# Patient Record
Sex: Male | Born: 1990 | Race: White | Hispanic: No | Marital: Married | Smoking: Never smoker
Health system: Southern US, Community
[De-identification: ages and names within clinical notes are randomized; demographics above are authoritative.]

## PROBLEM LIST (undated history)

## (undated) DIAGNOSIS — Z87442 Personal history of urinary calculi: Secondary | ICD-10-CM

## (undated) HISTORY — PX: OTHER SURGICAL HISTORY: SHX169

---
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
## 2011-01-22 ENCOUNTER — Inpatient Hospital Stay (INDEPENDENT_AMBULATORY_CARE_PROVIDER_SITE_OTHER)
Admission: RE | Admit: 2011-01-22 | Discharge: 2011-01-22 | Disposition: A | Discharge status: Home / Self Care | Payer: BC Managed Care – PPO | Source: Ambulatory Visit | Attending: Family Medicine | Admitting: Family Medicine

## 2011-01-22 DIAGNOSIS — J020 Streptococcal pharyngitis: Secondary | ICD-10-CM

## 2017-01-07 ENCOUNTER — Other Ambulatory Visit: Payer: Self-pay | Admitting: General Surgery

## 2017-01-09 ENCOUNTER — Encounter (HOSPITAL_BASED_OUTPATIENT_CLINIC_OR_DEPARTMENT_OTHER): Payer: Self-pay | Admitting: *Deleted

## 2017-01-09 NOTE — Progress Notes (Signed)
Bring all medications. Explained Boost drink to pt , pt is not planning to come pick up before surgery because he lives 1 1/2 hours away from here. Stated he may come day of surgery early - 2 1/2 hours early so he can drink Boost day of surgery.

## 2017-01-16 ENCOUNTER — Encounter (HOSPITAL_BASED_OUTPATIENT_CLINIC_OR_DEPARTMENT_OTHER)
Admission: RE | Disposition: A | Discharge status: Home / Self Care | Payer: Self-pay | Source: Ambulatory Visit | Attending: General Surgery

## 2017-01-16 ENCOUNTER — Ambulatory Visit (HOSPITAL_BASED_OUTPATIENT_CLINIC_OR_DEPARTMENT_OTHER): Payer: 59 | Admitting: Anesthesiology

## 2017-01-16 ENCOUNTER — Ambulatory Visit (HOSPITAL_BASED_OUTPATIENT_CLINIC_OR_DEPARTMENT_OTHER)
Admission: RE | Admit: 2017-01-16 | Discharge: 2017-01-16 | Disposition: A | Discharge status: Home / Self Care | Payer: 59 | Source: Ambulatory Visit | Attending: General Surgery | Admitting: General Surgery

## 2017-01-16 ENCOUNTER — Encounter (HOSPITAL_BASED_OUTPATIENT_CLINIC_OR_DEPARTMENT_OTHER): Payer: Self-pay | Admitting: Anesthesiology

## 2017-01-16 DIAGNOSIS — Z880 Allergy status to penicillin: Secondary | ICD-10-CM | POA: Diagnosis not present

## 2017-01-16 DIAGNOSIS — K4090 Unilateral inguinal hernia, without obstruction or gangrene, not specified as recurrent: Secondary | ICD-10-CM | POA: Insufficient documentation

## 2017-01-16 DIAGNOSIS — Z882 Allergy status to sulfonamides status: Secondary | ICD-10-CM | POA: Insufficient documentation

## 2017-01-16 DIAGNOSIS — Z87442 Personal history of urinary calculi: Secondary | ICD-10-CM | POA: Insufficient documentation

## 2017-01-16 HISTORY — PX: INSERTION OF MESH: SHX5868

## 2017-01-16 HISTORY — DX: Personal history of urinary calculi: Z87.442

## 2017-01-16 HISTORY — PX: INGUINAL HERNIA REPAIR: SHX194

## 2017-01-16 SURGERY — REPAIR, HERNIA, INGUINAL, ADULT
Anesthesia: General | Site: Groin | Laterality: Left

## 2017-01-16 MED ORDER — FENTANYL CITRATE (PF) 100 MCG/2ML IJ SOLN
INTRAMUSCULAR | Status: AC
  Filled 2017-01-16: qty 2

## 2017-01-16 MED ORDER — CEFAZOLIN SODIUM-DEXTROSE 2-4 GM/100ML-% IV SOLN
2.0000 g | INTRAVENOUS | Status: AC
  Administered 2017-01-16: 2 g via INTRAVENOUS

## 2017-01-16 MED ORDER — CELECOXIB 200 MG PO CAPS
200.0000 mg | ORAL_CAPSULE | ORAL | Status: AC
  Administered 2017-01-16: 200 mg via ORAL

## 2017-01-16 MED ORDER — FENTANYL CITRATE (PF) 100 MCG/2ML IJ SOLN
50.0000 ug | INTRAMUSCULAR | Status: DC | PRN
  Administered 2017-01-16: 100 ug via INTRAVENOUS
  Administered 2017-01-16: 50 ug via INTRAVENOUS

## 2017-01-16 MED ORDER — MEPERIDINE HCL 25 MG/ML IJ SOLN: 6.2500 mg | INTRAMUSCULAR | Status: DC | PRN

## 2017-01-16 MED ORDER — OXYCODONE-ACETAMINOPHEN 10-325 MG PO TABS: 1.0000 | ORAL_TABLET | Freq: Four times a day (QID) | ORAL | 0 refills | Status: AC | PRN

## 2017-01-16 MED ORDER — DEXAMETHASONE SODIUM PHOSPHATE 4 MG/ML IJ SOLN
INTRAMUSCULAR | Status: DC | PRN
  Administered 2017-01-16: 10 mg via INTRAVENOUS

## 2017-01-16 MED ORDER — GABAPENTIN 300 MG PO CAPS
300.0000 mg | ORAL_CAPSULE | ORAL | Status: AC
  Administered 2017-01-16: 300 mg via ORAL

## 2017-01-16 MED ORDER — DEXAMETHASONE SODIUM PHOSPHATE 10 MG/ML IJ SOLN
INTRAMUSCULAR | Status: AC
  Filled 2017-01-16: qty 1

## 2017-01-16 MED ORDER — LACTATED RINGERS IV SOLN
INTRAVENOUS | Status: DC
  Administered 2017-01-16 (×2): via INTRAVENOUS

## 2017-01-16 MED ORDER — CELECOXIB 200 MG PO CAPS
ORAL_CAPSULE | ORAL | Status: AC
  Filled 2017-01-16: qty 1

## 2017-01-16 MED ORDER — PROPOFOL 10 MG/ML IV BOLUS
INTRAVENOUS | Status: DC | PRN
  Administered 2017-01-16: 200 mg via INTRAVENOUS

## 2017-01-16 MED ORDER — GLYCOPYRROLATE 0.2 MG/ML IV SOSY
PREFILLED_SYRINGE | INTRAVENOUS | Status: DC | PRN
  Administered 2017-01-16: .2 mg via INTRAVENOUS

## 2017-01-16 MED ORDER — LIDOCAINE 2% (20 MG/ML) 5 ML SYRINGE
INTRAMUSCULAR | Status: DC | PRN
  Administered 2017-01-16: 80 mg via INTRAVENOUS

## 2017-01-16 MED ORDER — ACETAMINOPHEN 500 MG PO TABS
ORAL_TABLET | ORAL | Status: AC
  Filled 2017-01-16: qty 2

## 2017-01-16 MED ORDER — OXYCODONE HCL 5 MG PO TABS: 5.0000 mg | ORAL_TABLET | Freq: Once | ORAL | Status: DC | PRN

## 2017-01-16 MED ORDER — PROMETHAZINE HCL 25 MG/ML IJ SOLN: 6.2500 mg | INTRAMUSCULAR | Status: DC | PRN

## 2017-01-16 MED ORDER — BUPIVACAINE HCL (PF) 0.25 % IJ SOLN
INTRAMUSCULAR | Status: DC | PRN
  Administered 2017-01-16: 3 mL

## 2017-01-16 MED ORDER — LACTATED RINGERS IV SOLN: INTRAVENOUS | Status: DC

## 2017-01-16 MED ORDER — MIDAZOLAM HCL 2 MG/2ML IJ SOLN
INTRAMUSCULAR | Status: AC
  Filled 2017-01-16: qty 2

## 2017-01-16 MED ORDER — LIDOCAINE 2% (20 MG/ML) 5 ML SYRINGE
INTRAMUSCULAR | Status: AC
  Filled 2017-01-16: qty 5

## 2017-01-16 MED ORDER — BUPIVACAINE-EPINEPHRINE (PF) 0.5% -1:200000 IJ SOLN
INTRAMUSCULAR | Status: DC | PRN
  Administered 2017-01-16: 30 mL

## 2017-01-16 MED ORDER — FENTANYL CITRATE (PF) 100 MCG/2ML IJ SOLN: 25.0000 ug | INTRAMUSCULAR | Status: DC | PRN

## 2017-01-16 MED ORDER — GABAPENTIN 300 MG PO CAPS
ORAL_CAPSULE | ORAL | Status: AC
  Filled 2017-01-16: qty 1

## 2017-01-16 MED ORDER — ACETAMINOPHEN 500 MG PO TABS
1000.0000 mg | ORAL_TABLET | ORAL | Status: AC
  Administered 2017-01-16: 1000 mg via ORAL

## 2017-01-16 MED ORDER — SCOPOLAMINE 1 MG/3DAYS TD PT72: 1.0000 | MEDICATED_PATCH | Freq: Once | TRANSDERMAL | Status: DC | PRN

## 2017-01-16 MED ORDER — MIDAZOLAM HCL 2 MG/2ML IJ SOLN
1.0000 mg | INTRAMUSCULAR | Status: DC | PRN
  Administered 2017-01-16 (×2): 2 mg via INTRAVENOUS

## 2017-01-16 MED ORDER — CEFAZOLIN SODIUM-DEXTROSE 2-4 GM/100ML-% IV SOLN
INTRAVENOUS | Status: AC
  Filled 2017-01-16: qty 100

## 2017-01-16 MED ORDER — ONDANSETRON HCL 4 MG/2ML IJ SOLN
INTRAMUSCULAR | Status: AC
  Filled 2017-01-16: qty 2

## 2017-01-16 MED ORDER — PROPOFOL 10 MG/ML IV BOLUS
INTRAVENOUS | Status: AC
  Filled 2017-01-16: qty 20

## 2017-01-16 MED ORDER — OXYCODONE HCL 5 MG/5ML PO SOLN: 5.0000 mg | Freq: Once | ORAL | Status: DC | PRN

## 2017-01-16 SURGICAL SUPPLY — 41 items
ADH SKN CLS APL DERMABOND .7 (GAUZE/BANDAGES/DRESSINGS) ×1
BLADE CLIPPER SURG (BLADE) IMPLANT
BLADE SURG 15 STRL LF DISP TIS (BLADE) ×1 IMPLANT
BLADE SURG 15 STRL SS (BLADE) ×3
CHLORAPREP W/TINT 26ML (MISCELLANEOUS) ×3 IMPLANT
COVER BACK TABLE 60X90IN (DRAPES) ×3 IMPLANT
COVER MAYO STAND STRL (DRAPES) ×3 IMPLANT
DECANTER SPIKE VIAL GLASS SM (MISCELLANEOUS) IMPLANT
DERMABOND ADVANCED (GAUZE/BANDAGES/DRESSINGS) ×2
DERMABOND ADVANCED .7 DNX12 (GAUZE/BANDAGES/DRESSINGS) ×1 IMPLANT
DRAIN PENROSE 1/2X12 LTX STRL (WOUND CARE) ×3 IMPLANT
DRAPE LAPAROTOMY TRNSV 102X78 (DRAPE) ×3 IMPLANT
DRAPE UTILITY XL STRL (DRAPES) ×3 IMPLANT
ELECT COATED BLADE 2.86 ST (ELECTRODE) ×3 IMPLANT
ELECT REM PT RETURN 9FT ADLT (ELECTROSURGICAL) ×3
ELECTRODE REM PT RTRN 9FT ADLT (ELECTROSURGICAL) ×1 IMPLANT
GLOVE BIO SURGEON STRL SZ7 (GLOVE) ×3 IMPLANT
GLOVE BIOGEL PI IND STRL 7.5 (GLOVE) ×1 IMPLANT
GLOVE BIOGEL PI INDICATOR 7.5 (GLOVE) ×2
GLOVE SURG SS PI 6.5 STRL IVOR (GLOVE) ×4 IMPLANT
GOWN STRL REUS W/ TWL LRG LVL3 (GOWN DISPOSABLE) ×2 IMPLANT
GOWN STRL REUS W/TWL LRG LVL3 (GOWN DISPOSABLE) ×6
MESH ULTRAPRO 3X6 7.6X15CM (Mesh General) ×2 IMPLANT
NEEDLE HYPO 22GX1.5 SAFETY (NEEDLE) ×3 IMPLANT
NS IRRIG 1000ML POUR BTL (IV SOLUTION) IMPLANT
PACK BASIN DAY SURGERY FS (CUSTOM PROCEDURE TRAY) ×3 IMPLANT
PENCIL BUTTON HOLSTER BLD 10FT (ELECTRODE) ×3 IMPLANT
SLEEVE SCD COMPRESS KNEE MED (MISCELLANEOUS) IMPLANT
SPONGE LAP 4X18 X RAY DECT (DISPOSABLE) ×3 IMPLANT
SUT MNCRL AB 4-0 PS2 18 (SUTURE) ×3 IMPLANT
SUT SILK 2 0 SH (SUTURE) IMPLANT
SUT VIC AB 0 SH 27 (SUTURE) IMPLANT
SUT VIC AB 2-0 SH 18 (SUTURE) ×5 IMPLANT
SUT VIC AB 2-0 SH 27 (SUTURE)
SUT VIC AB 2-0 SH 27XBRD (SUTURE) IMPLANT
SUT VIC AB 3-0 SH 27 (SUTURE) ×3
SUT VIC AB 3-0 SH 27X BRD (SUTURE) ×1 IMPLANT
SUT VICRYL AB 3 0 TIES (SUTURE) ×2 IMPLANT
SYR CONTROL 10ML LL (SYRINGE) ×3 IMPLANT
TOWEL OR 17X24 6PK STRL BLUE (TOWEL DISPOSABLE) ×3 IMPLANT
TOWEL OR NON WOVEN STRL DISP B (DISPOSABLE) ×3 IMPLANT

## 2017-01-16 NOTE — Anesthesia Procedure Notes (Signed)
Procedure Name: LMA Insertion Date/Time: 01/16/2017 2:57 PM Performed by: Gar Gibbon Pre-anesthesia Checklist: Patient identified, Emergency Drugs available, Suction available and Patient being monitored Patient Re-evaluated:Patient Re-evaluated prior to inductionOxygen Delivery Method: Circle system utilized Preoxygenation: Pre-oxygenation with 100% oxygen Intubation Type: IV induction Ventilation: Mask ventilation without difficulty LMA: LMA inserted LMA Size: 4.0 Number of attempts: 1 Airway Equipment and Method: Bite block Placement Confirmation: positive ETCO2 Tube secured with: Tape Dental Injury: Teeth and Oropharynx as per pre-operative assessment

## 2017-01-16 NOTE — Anesthesia Preprocedure Evaluation (Addendum)
Anesthesia Evaluation  Patient identified by MRN, date of birth, ID band Patient awake    Reviewed: Allergy & Precautions, NPO status , Patient's Chart, lab work & pertinent test results  Airway Mallampati: I       Dental  (+) Teeth Intact, Dental Advisory Given   Pulmonary neg pulmonary ROS,    breath sounds clear to auscultation       Cardiovascular negative cardio ROS   Rhythm:Regular Rate:Normal     Neuro/Psych negative neurological ROS  negative psych ROS   GI/Hepatic negative GI ROS, Neg liver ROS,   Endo/Other  negative endocrine ROS  Renal/GU negative Renal ROS  negative genitourinary   Musculoskeletal negative musculoskeletal ROS (+)   Abdominal   Peds negative pediatric ROS (+)  Hematology negative hematology ROS (+)   Anesthesia Other Findings   Reproductive/Obstetrics negative OB ROS                            Anesthesia Physical Anesthesia Plan  ASA: II  Anesthesia Plan: General   Post-op Pain Management:  Regional for Post-op pain   Induction: Intravenous  Airway Management Planned:   Additional Equipment:   Intra-op Plan:   Post-operative Plan: Extubation in OR  Informed Consent: I have reviewed the patients History and Physical, chart, labs and discussed the procedure including the risks, benefits and alternatives for the proposed anesthesia with the patient or authorized representative who has indicated his/her understanding and acceptance.   Dental advisory given  Plan Discussed with: CRNA  Anesthesia Plan Comments:         Anesthesia Quick Evaluation

## 2017-01-16 NOTE — Progress Notes (Signed)
Patient ID: Joseph Sanders, male   DOB: 16-Jun-1991, 26 y.o.   MRN: [id number] I reviewed [facility] on day of surgery, rx for postop pain give

## 2017-01-16 NOTE — Discharge Instructions (Signed)
[facility] ° °UMBILICAL OR INGUINAL HERNIA REPAIR: POST OP INSTRUCTIONS ° °Always review your discharge instruction sheet given to you by the facility where your surgery was performed. °IF YOU HAVE DISABILITY OR FAMILY LEAVE FORMS, YOU MUST BRING THEM TO THE OFFICE FOR PROCESSING.   °DO NOT GIVE THEM TO YOUR DOCTOR. ° °1. A  prescription for pain medication may be given to you upon discharge.  Take your pain medication as prescribed, if needed.  If narcotic pain medicine is not needed, then you may take acetaminophen (Tylenol), naprosyn (Alleve) or ibuprofen (Advil) as needed. °2. Take your usually prescribed medications unless otherwise directed. °3. If you need a refill on your pain medication, please contact your pharmacy.  They will contact our office to request authorization. Prescriptions will not be filled after 5 pm or on week-ends. °4. You should follow a light diet the first 24 hours after arrival home, such as soup and crackers, etc.  Be sure to include lots of fluids daily.  Resume your normal diet the day after surgery. °5. Most patients will experience some swelling and bruising around the umbilicus or in the groin and scrotum.  Ice packs and reclining will help.  Swelling and bruising can take several days to resolve.  °6. It is common to experience some constipation if taking pain medication after surgery.  Increasing fluid intake and taking a stool softener (such as Colace) will usually help or prevent this problem from occurring.  A mild laxative (Milk of Magnesia or Miralax) should be taken according to package directions if there are no bowel movements after 48 hours. °7. Unless discharge instructions indicate otherwise, you may remove your bandages 48 hours after surgery, and you may shower at that time.  You may have steri-strips (small skin tapes) in place directly over the incision.  These strips should be left on the skin for 7-10 days and will come off on their own.   If your surgeon used skin glue on the incision, you may shower in 24 hours.  The glue will flake off over the next 2-3 weeks.  Any sutures or staples will be removed at the office during your follow-up visit. °8. ACTIVITIES:  You may resume regular (light) daily activities beginning the next day--such as daily self-care, walking, climbing stairs--gradually increasing activities as tolerated.  You may have sexual intercourse when it is comfortable.  Refrain from any heavy lifting or straining until approved by your doctor. °a. You may drive when you are no longer taking prescription pain medication, you can comfortably wear a seatbelt, and you can safely maneuver your car and apply brakes. °b. RETURN TO WORK:  __________________________________________________________ °9. You should see your doctor in the office for a follow-up appointment approximately 2-3 weeks after your surgery.  Make sure that you call for this appointment within a day or two after you arrive home to insure a convenient appointment time. °10. OTHER INSTRUCTIONS:  __________________________________________________________________________________________________________________________________________________________________________________________  °WHEN TO CALL YOUR DOCTOR: °1. Fever over 101.0 °2. Inability to urinate °3. Nausea and/or vomiting °4. Extreme swelling or bruising °5. Continued bleeding from incision. °6. Increased pain, redness, or drainage from the incision ° °The clinic staff is available to answer your questions during regular business hours.  Please don’t hesitate to call and ask to speak to one of the nurses for clinical concerns.  If you have a medical emergency, go to the nearest emergency room or call 911.  A surgeon from [facility]   is always on call at the hospital ° ° °[address], [address], [place], [place]  [address] ? ° [address] [address], [place], [place]   [address] °[phone] ? [phone] ? FAX  [phone] °Web site: [email or web address] ° ° °Post Anesthesia Home Care Instructions ° °Activity: °Get plenty of rest for the remainder of the day. A responsible individual must stay with you for 24 hours following the procedure.  °For the next 24 hours, DO NOT: °-Drive a car °-Operate machinery °-Drink alcoholic beverages °-Take any medication unless instructed by your physician °-Make any legal decisions or sign important papers. ° °Meals: °Start with liquid foods such as gelatin or soup. Progress to regular foods as tolerated. Avoid greasy, spicy, heavy foods. If nausea and/or vomiting occur, drink only clear liquids until the nausea and/or vomiting subsides. Call your physician if vomiting continues. ° °Special Instructions/Symptoms: °Your throat may feel dry or sore from the anesthesia or the breathing tube placed in your throat during surgery. If this causes discomfort, gargle with warm salt water. The discomfort should disappear within 24 hours. ° °If you had a scopolamine patch placed behind your ear for the management of post- operative nausea and/or vomiting: ° °1. The medication in the patch is effective for 72 hours, after which it should be removed.  Wrap patch in a tissue and discard in the trash. Wash hands thoroughly with soap and water. °2. You may remove the patch earlier than 72 hours if you experience unpleasant side effects which may include dry mouth, dizziness or visual disturbances. °3. Avoid touching the patch. Wash your hands with soap and water after contact with the patch. °  ° °

## 2017-01-16 NOTE — Progress Notes (Signed)
Assisted Dr. Hollis with left, ultrasound guided, transabdominal plane block. Side rails up, monitors on throughout procedure. See vital signs in flow sheet. Tolerated Procedure well. 

## 2017-01-16 NOTE — Anesthesia Postprocedure Evaluation (Addendum)
**Note Joseph-Identified via Obfuscation** Anesthesia Post Note  Patient: Joseph Sanders  Procedure(s) Performed: Procedure(s) (LRB): LEFT INGUINAL HERNIA REPAIR (Left) INSERTION OF MESH (Left)  Patient location during evaluation: PACU Anesthesia Type: General Level of consciousness: awake and alert Pain management: pain level controlled Vital Signs Assessment: post-procedure vital signs reviewed and stable Respiratory status: spontaneous breathing, nonlabored ventilation, respiratory function stable and patient connected to nasal cannula oxygen Cardiovascular status: blood pressure returned to baseline and stable Postop Assessment: no signs of nausea or vomiting Anesthetic complications: no       Last Vitals:  Vitals:   01/16/17 1615 01/16/17 1642  BP: (!) 127/94 (!) 121/92  Pulse:  (!) 56  Resp: 16 16  Temp:  36.6 C    Last Pain:  Vitals:   01/16/17 1642  TempSrc: Oral  PainSc: 3                  Shelton Silvas
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
## 2017-01-16 NOTE — Transfer of Care (Signed)
Immediate Anesthesia Transfer of Care Note  Patient: Joseph Sanders  Procedure(s) Performed: Procedure(s): LEFT INGUINAL HERNIA REPAIR (Left) INSERTION OF MESH (Left)  Patient Location: PACU  Anesthesia Type:GA combined with regional for post-op pain  Level of Consciousness: awake, sedated and patient cooperative  Airway & Oxygen Therapy: Patient Spontanous Breathing and Patient connected to face mask oxygen  Post-op Assessment: Report given to RN and Post -op Vital signs reviewed and stable  Post vital signs: Reviewed and stable  Last Vitals:  Vitals:   01/16/17 1439 01/16/17 1440  BP:  121/68  Pulse: 64 (!) 58  Resp: 20 (!) 21  Temp:      Last Pain:  Vitals:   01/16/17 1440  TempSrc:   PainSc: 0-No pain         Complications: No apparent anesthesia complications

## 2017-01-16 NOTE — Interval H&P Note (Signed)
History and Physical Interval Note:  01/16/2017 2:09 PM  Joseph Sanders  has presented today for surgery, with the diagnosis of left inguinal hernia  The various methods of treatment have been discussed with the patient and family. After consideration of risks, benefits and other options for treatment, the patient has consented to  Procedure(s): LEFT INGUINAL HERNIA REPAIR (Left) INSERTION OF MESH (Left) as a surgical intervention .  The patient's history has been reviewed, patient examined, no change in status, stable for surgery.  I have reviewed the patient's chart and labs.  Questions were answered to the patient's satisfaction.     [name]

## 2017-01-16 NOTE — Anesthesia Procedure Notes (Addendum)
Anesthesia Regional Block: TAP block   Pre-Anesthetic Checklist: ,, timeout performed, Correct Patient, Correct Site, Correct Laterality, Correct Procedure, Correct Position, site marked, Risks and benefits discussed,  Surgical consent,  Pre-op evaluation,  At surgeon's request and post-op pain management  Laterality: Left  Prep: chloraprep       Needles:  Injection technique: Single-shot  Needle Type: Echogenic Needle     Needle Length: 9cm  Needle Gauge: 21     Additional Needles:   Procedures: ultrasound guided,,,,,,,,  Narrative:  Start time: 01/16/2017 2:35 PM End time: 01/16/2017 2:40 PM Injection made incrementally with aspirations every 5 mL.  Performed by: Personally  Anesthesiologist: Shona Simpson D  Additional Notes: Tolerated well.

## 2017-01-16 NOTE — Op Note (Signed)
Preoperative diagnosis: left inguinal hernia Postoperative diagnosis: left inguinal hernia, direct Procedure: leftinguinal hernia repair with ultrapro mesh Surgeon: Dr Harden Mo Anesthesia: Gen. with a TAPblock Estimated blood loss: Minimal Drains: None Specimens: None Complications: None Disposition to recovery in stable condition  Indications: This is a 48 yom with symptomatic left inguinal hernia We discussed all of his operative risks and elected to proceed with inguinal hernia repair.  Procedure: After informed consent was obtained the patient was taken to the operating room. He underwent a TAPblock first. He was given antibiotics. He was placed under general anesthesia. He was prepped and draped in standard sterile surgical fashion. A timeout was performed.  I infiltrated marcaine in the skin. I made a leftgroin incision.I then identified the external oblique. I then opened the external oblique. I then noted him to have a small direct hernia. There was no indirect hernia present.  I then placed an ultrapro mesh patch over the floor. I then sutured the mesh to the pubic tubercle with 2-0 vicryl. I made a cut and wrapped this around the spermatic cord. I secured this every half centimeter to the inguinal ligament as well. I then secured this to the internal oblique superiorly. I laid the lateral portion flat. This completely obliterated the defect. Hemostasis wasobserved. I then closed this with 2-0 Vicryl and 3-0 Vicryl and 4-0 Monocryl. Dermabond and Steri-Strips were applied. He tolerated this well was extubated and transferred to recovery in stable condition.

## 2017-01-17 ENCOUNTER — Encounter (HOSPITAL_BASED_OUTPATIENT_CLINIC_OR_DEPARTMENT_OTHER): Payer: Self-pay | Admitting: General Surgery

## 2017-02-22 NOTE — Addendum Note (Signed)
Addendum  created 02/22/17 1248 by Shelton SilvasHollis, Geraldina Parrott D, MD   Sign clinical note

## 2020-04-30 ENCOUNTER — Emergency Department (HOSPITAL_COMMUNITY)
Admission: EM | Admit: 2020-04-30 | Discharge: 2020-04-30 | Disposition: A | Discharge status: Home / Self Care | Payer: Commercial Managed Care - PPO | Attending: Emergency Medicine | Admitting: Emergency Medicine

## 2020-04-30 ENCOUNTER — Other Ambulatory Visit: Payer: Self-pay

## 2020-04-30 ENCOUNTER — Emergency Department (HOSPITAL_COMMUNITY): Payer: Commercial Managed Care - PPO

## 2020-04-30 ENCOUNTER — Encounter (HOSPITAL_COMMUNITY): Payer: Self-pay

## 2020-04-30 DIAGNOSIS — U071 COVID-19: Secondary | ICD-10-CM

## 2020-04-30 DIAGNOSIS — R0602 Shortness of breath: Secondary | ICD-10-CM | POA: Diagnosis present

## 2020-04-30 DIAGNOSIS — J1282 Pneumonia due to coronavirus disease 2019: Secondary | ICD-10-CM | POA: Insufficient documentation

## 2020-04-30 LAB — BASIC METABOLIC PANEL
Anion gap: 10 (ref 5–15)
BUN: 12 mg/dL (ref 6–20)
CO2: 26 mmol/L (ref 22–32)
Calcium: 8.1 mg/dL — ABNORMAL LOW (ref 8.9–10.3)
Chloride: 99 mmol/L (ref 98–111)
Creatinine, Ser: 1.15 mg/dL (ref 0.61–1.24)
GFR calc Af Amer: >60 mL/min (ref >60)
GFR calc non Af Amer: >60 mL/min (ref >60)
Glucose, Bld: 134 mg/dL — ABNORMAL HIGH (ref 70–99)
Potassium: 3.9 mmol/L (ref 3.5–5.1)
Sodium: 135 mmol/L (ref 135–145)

## 2020-04-30 LAB — CBC WITH DIFFERENTIAL/PLATELET
Abs Immature Granulocytes: 0 10*3/uL (ref 0.00–0.07)
Basophils Absolute: 0 10*3/uL (ref 0.0–0.1)
Basophils Relative: 0 %
Eosinophils Absolute: 0 10*3/uL (ref 0.0–0.5)
Eosinophils Relative: 0 %
HCT: 45.4 % (ref 39.0–52.0)
Hemoglobin: 15.1 g/dL (ref 13.0–17.0)
Immature Granulocytes: 0 %
Lymphocytes Relative: 28 %
Lymphs Abs: 0.9 10*3/uL (ref 0.7–4.0)
MCH: 32 pg (ref 26.0–34.0)
MCHC: 33.3 g/dL (ref 30.0–36.0)
MCV: 96.2 fL (ref 80.0–100.0)
Monocytes Absolute: 0.2 10*3/uL (ref 0.1–1.0)
Monocytes Relative: 7 %
Neutro Abs: 2 10*3/uL (ref 1.7–7.7)
Neutrophils Relative %: 65 %
Platelets: 121 10*3/uL — ABNORMAL LOW (ref 150–400)
RBC: 4.72 MIL/uL (ref 4.22–5.81)
RDW: 12.7 % (ref 11.5–15.5)
WBC: 3.2 10*3/uL — ABNORMAL LOW (ref 4.0–10.5)
nRBC: 0 % (ref 0.0–0.2)

## 2020-04-30 LAB — TROPONIN I (HIGH SENSITIVITY)
Troponin I (High Sensitivity): 4 ng/L (ref <18)
Troponin I (High Sensitivity): 3 ng/L (ref <18)

## 2020-04-30 LAB — SARS CORONAVIRUS 2 BY RT PCR (HOSPITAL ORDER, PERFORMED IN ~~LOC~~ HOSPITAL LAB): SARS Coronavirus 2: POSITIVE — AB

## 2020-04-30 LAB — D-DIMER, QUANTITATIVE: D-Dimer, Quant: 0.71 ug/mL-FEU — ABNORMAL HIGH (ref 0.00–0.50)

## 2020-04-30 MED ORDER — IOHEXOL 350 MG/ML SOLN
75.0000 mL | Freq: Once | INTRAVENOUS | Status: AC | PRN
  Administered 2020-04-30: 75 mL via INTRAVENOUS

## 2020-04-30 MED ORDER — AEROCHAMBER PLUS FLO-VU MISC
1.0000 | Freq: Once | Status: AC
  Administered 2020-04-30: 1
  Filled 2020-04-30: qty 1

## 2020-04-30 MED ORDER — ALBUTEROL SULFATE HFA 108 (90 BASE) MCG/ACT IN AERS
1.0000 | INHALATION_SPRAY | RESPIRATORY_TRACT | Status: DC | PRN
  Administered 2020-04-30: 2 via RESPIRATORY_TRACT
  Filled 2020-04-30: qty 6.7

## 2020-04-30 MED ORDER — DEXAMETHASONE SODIUM PHOSPHATE 10 MG/ML IJ SOLN
10.0000 mg | Freq: Once | INTRAMUSCULAR | Status: AC
  Administered 2020-04-30: 10 mg via INTRAVENOUS
  Filled 2020-04-30: qty 1

## 2020-04-30 NOTE — ED Notes (Signed)
Date and time results received: 04/30/20 1725 (use smartphrase ".now" to insert current time)  Test: Covid Critical Value: Positive  Name of Provider Notified:Brandon St. James PA  Orders Received? Or Actions Taken?: NA

## 2020-04-30 NOTE — ED Notes (Signed)
o2 dropped to 92% while walking around room, it went back up to 96% after laying back down.

## 2020-04-30 NOTE — ED Provider Notes (Addendum)
[facility] EMERGENCY DEPARTMENT Provider Note   CSN: [id number] Arrival date & time: 04/30/20  1526     History Chief Complaint  Patient presents with  . Shortness of Breath    Joseph Sanders is a 29 y.o. male history of kidney stones and hernia repair.  Otherwise healthy no daily medication use.  Patient reports that on Sunday, April 24, 2020 he developed a fever of 102.8 F.  He took Tylenol with resolution of the fever and felt well the rest of the day as well as on Monday.  On Tuesday, August 3 his fever returned and he developed congestion and nonproductive cough.  The symptoms continued to worsen and then 2 days ago he developed shortness of breath and chest pain.  He describes shortness of breath as difficulty catching a full breath with some pain with deep inhalation, pain is sharp generalized without radiation, moderate intensity improves with rest.  He reports loss of taste over the last 2 days as well as generalized body aches.  Patient reports that he has not received the COVID-19 vaccination.  Denies headache, vision changes, neck stiffness, hemoptysis, abdominal pain, vomiting, diarrhea, extremity swelling/color change or any additional concerns.  HPI     Past Medical History:  Diagnosis Date  . History of kidney stones    2010    There are no problems to display for this patient.   Past Surgical History:  Procedure Laterality Date  . INGUINAL HERNIA REPAIR Left 01/16/2017   Procedure: LEFT INGUINAL HERNIA REPAIR;  Surgeon: Emelia Loron, MD;  Location: [facility];  Service: General;  Laterality: Left;  . INSERTION OF MESH Left 01/16/2017   Procedure: INSERTION OF MESH;  Surgeon: Emelia Loron, MD;  Location: [facility];  Service: General;  Laterality: Left;  . removal of kidney stone    . wisdom teeth removal         No family history on file.  Social History   Tobacco Use  . Smoking status: Never Smoker  .  Smokeless tobacco: Current User    Types: Snuff  Substance Use Topics  . Alcohol use: Yes    Comment: 1-2 drinks a month  . Drug use: No    Home Medications Prior to Admission medications   Medication Sig Start Date End Date Taking? Authorizing Provider  lamoTRIgine (LAMICTAL) 25 MG tablet Take 50 mg by mouth daily. 04/29/20  Yes [provider]  Multiple Vitamin (MULTIVITAMIN) tablet Take 1 tablet by mouth daily.   Yes [provider]  Omega-3 Fatty Acids (FISH OIL PO) Take by mouth.   Yes [provider]  Whey Protein POWD Take by mouth.   Yes [provider]    Allergies    Penicillins and Sulfa antibiotics  Review of Systems   Review of Systems Ten systems are reviewed and are negative for acute change except as noted in the HPI  Physical Exam Updated Vital Signs BP 116/72   Pulse 74   Temp 99 F (37.2 C) (Oral)   Resp 17   Ht 5\' 11"  (1.803 m)   Wt 83.9 kg   SpO2 97%   BMI 25.80 kg/m   Physical Exam Constitutional:      General: He is not in acute distress.    Appearance: Normal appearance. He is well-developed. He is not ill-appearing or diaphoretic.  HENT:     Head: Normocephalic and atraumatic.  Eyes:     General: Vision grossly intact.  Gaze aligned appropriately.     Pupils: Pupils are equal, round, and reactive to light.  Neck:     Trachea: Trachea and phonation normal.     Meningeal: Brudzinski's sign absent.  Cardiovascular:     Rate and Rhythm: Normal rate and regular rhythm.  Pulmonary:     Effort: Pulmonary effort is normal. No respiratory distress.     Breath sounds: Normal breath sounds.  Abdominal:     General: There is no distension.     Palpations: Abdomen is soft.     Tenderness: There is no abdominal tenderness. There is no guarding or rebound.  Musculoskeletal:        General: Normal range of motion.     Cervical back: Normal range of motion and neck supple.     Right lower leg: No tenderness. No  edema.     Left lower leg: No tenderness. No edema.  Skin:    General: Skin is warm and dry.  Neurological:     Mental Status: He is alert.     GCS: GCS eye subscore is 4. GCS verbal subscore is 5. GCS motor subscore is 6.     Comments: Speech is clear and goal oriented, follows commands Major Cranial nerves without deficit, no facial droop Moves extremities without ataxia, coordination intact  Psychiatric:        Behavior: Behavior normal.     ED Results / Procedures / Treatments   Labs (all labs ordered are listed, but only abnormal results are displayed) Labs Reviewed  SARS CORONAVIRUS 2 BY RT PCR (HOSPITAL ORDER, PERFORMED IN [facility]) - Abnormal; Notable for the following components:      Result Value   SARS Coronavirus 2 POSITIVE (*)    All other components within normal limits  BASIC METABOLIC PANEL - Abnormal; Notable for the following components:   Glucose, Bld 134 (*)    Calcium 8.1 (*)    All other components within normal limits  CBC WITH DIFFERENTIAL/PLATELET - Abnormal; Notable for the following components:   WBC 3.2 (*)    Platelets 121 (*)    All other components within normal limits  D-DIMER, QUANTITATIVE (NOT AT [facility]) - Abnormal; Notable for the following components:   D-Dimer, Quant 0.71 (*)    All other components within normal limits  TROPONIN I (HIGH SENSITIVITY)  TROPONIN I (HIGH SENSITIVITY)    EKG EKG Interpretation  Date/Time:  Saturday April 30 2020 15:47:37 EDT Ventricular Rate:  88 PR Interval:    QRS Duration: 82 QT Interval:  337 QTC Calculation: 408 R Axis:   85 Text Interpretation: Sinus rhythm RAE, consider biatrial enlargement Borderline T abnormalities, inferior leads Confirmed by Raeford Razor [phone]) on 04/30/2020 5:12:03 PM   Radiology CT Angio Chest PE W and/or Wo Contrast  Result Date: 04/30/2020 CLINICAL DATA:  Suspect pulmonary embolus. Covid-19 positive. High probability positive for pulmonary embolus.  Increased shortness of breath. Fever, congestion. EXAM: CT ANGIOGRAPHY CHEST WITH CONTRAST TECHNIQUE: Multidetector CT imaging of the chest was performed using the standard protocol during bolus administration of intravenous contrast. Multiplanar CT image reconstructions and MIPs were obtained to evaluate the vascular anatomy. CONTRAST:  11mL OMNIPAQUE IOHEXOL 350 MG/ML SOLN COMPARISON:  04/30/2020 chest x-ray FINDINGS: Cardiovascular: Heart size is normal. No pericardial effusion. Pulmonary arteries are moderately well opacified. There is no acute pulmonary embolus. No significant atherosclerotic calcification of coronary vessels. Thoracic aorta is unremarkable. Mediastinum/Nodes: The visualized portion of the thyroid gland has a  normal appearance. Esophagus is unremarkable. Lungs/Pleura: There are numerous patchy irregular areas of consolidation within the periphery of all lobes. Confluent opacity is identified in the posterior LOWER lobes bilaterally. No pulmonary edema. No pleural effusions. Upper Abdomen: No acute abnormality. Musculoskeletal: No chest Spadaccini abnormality. No acute or significant osseous findings. Review of the MIP images confirms the above findings. IMPRESSION: 1. Technically adequate exam showing no acute pulmonary embolus. 2. Numerous patchy irregular areas of consolidation primarily within the periphery of all lobes with confluent opacity in the posterior LOWER lobes bilaterally. Findings are consistent with infectious process and is consistent with COVID-19 pneumonia. Electronically Signed   By: Norva Pavlov M.D.   On: 04/30/2020 18:02   DG Chest Port 1 View  Result Date: 04/30/2020 CLINICAL DATA:  Cough, shortness of breath and fever. EXAM: PORTABLE CHEST 1 VIEW COMPARISON:  None. FINDINGS: Mild left perihilar atelectasis and/or infiltrate is seen. There is no evidence of a pleural effusion or pneumothorax. The heart size and mediastinal contours are within normal limits. The  visualized skeletal structures are unremarkable. IMPRESSION: Mild left perihilar atelectasis and/or infiltrate. Electronically Signed   By: Aram Candela M.D.   On: 04/30/2020 16:20    Procedures Procedures (including critical care time)  Medications Ordered in ED Medications  dexamethasone (DECADRON) injection 10 mg (has no administration in time range)  albuterol (VENTOLIN HFA) 108 (90 Base) MCG/ACT inhaler 1-2 puff (has no administration in time range)  aerochamber plus with mask device 1 each (has no administration in time range)  iohexol (OMNIPAQUE) 350 MG/ML injection 75 mL (75 mLs Intravenous Contrast Given 04/30/20 1740)    ED Course  I have reviewed the triage vital signs and the nursing notes.  Pertinent labs & imaging results that were available during my care of the patient were reviewed by me and considered in my medical decision making (see chart for details).    MDM Rules/Calculators/A&P                          Additional history obtained from: 1. Nursing notes from this visit. ------------------------ I ordered, reviewed and interpreted labs which include: Covid test positive. D-dimer elevated 0.71, CT PE study ordered. Initial and high-sensitivity troponin within normal limits (4->3). CBC shows mild leukopenia and thrombocytopenia consistent with COVID-19 infection.   BMP shows no emergent electrolyte derangement, AKI or gap.  EKG: Sinus rhythm RAE, consider biatrial enlargement Borderline T abnormalities, inferior leads Confirmed by Raeford Razor [phone]) on 04/30/2020 5:12:03 PM  CXR: Sinus rhythm RAE, consider biatrial enlargement Borderline T abnormalities, inferior leads Confirmed by Raeford Razor [phone]) on 04/30/2020 5:12:03 PM  CT Angio PE Study:  IMPRESSION:  1. Technically adequate exam showing no acute pulmonary embolus.  2. Numerous patchy irregular areas of consolidation primarily within  the periphery of all lobes with confluent opacity in the  posterior  LOWER lobes bilaterally. Findings are consistent with infectious  process and is consistent with COVID-19 pneumonia.  - Suspect patient symptoms today secondary to COVID-19 viral infection.  No evidence of ACS, PE, dissection or other acute cardiopulmonary etiologies at this time.  No indication for antibiotics.  Patient ambulated with nursing staff SPO2 at 92 percent on room air.  Does not meet admission criteria.  Discussed case with Dr. Juleen China. Will treat with Decadron and referred to monoclonal antibody infusion center. Message sent to [facility].  At this time there does not appear to be any evidence of an acute  emergency medical condition and the patient appears stable for discharge with appropriate outpatient follow up. Diagnosis was discussed with patient who verbalizes understanding of care plan and is agreeable to discharge. I have discussed return precautions with patient who verbalizes understanding. Patient encouraged to follow-up with their PCP. All questions answered.  Patient's case discussed with Dr. Juleen ChinaKohut who agrees with plan to discharge with follow-up.   Daiki A Gugel was evaluated in Emergency Department on 04/30/2020 for the symptoms described in the history of present illness. He was evaluated in the context of the global COVID-19 pandemic, which necessitated consideration that the patient might be at risk for infection with the SARS-CoV-2 virus that causes COVID-19. Institutional protocols and algorithms that pertain to the evaluation of patients at risk for COVID-19 are in a state of rapid change based on information released by regulatory bodies including the CDC and federal and state organizations. These policies and algorithms were followed during the patient's care in the ED.  Note: Portions of this report may have been transcribed using voice recognition software. Every effort was made to ensure accuracy; however, inadvertent computerized transcription errors may still be  present. Final Clinical Impression(s) / ED Diagnoses Final diagnoses:  COVID-19 virus detected  Pneumonia due to COVID-19 virus    Rx / DC Orders ED Discharge Orders    None       Bill SalinasMorelli, Ninnie Fein A, PA-C 04/30/20 1911    Elizabeth PalauMorelli, Francina Beery A, PA-C 04/30/20 1915    Raeford RazorKohut, Stephen, MD 05/02/20 0009

## 2020-04-30 NOTE — Discharge Instructions (Addendum)
At this time there does not appear to be the presence of an emergent medical condition, however there is always the potential for conditions to change. Please read and follow the below instructions.  Please return to the Emergency Department immediately for any new or worsening symptoms. Please be sure to follow up with your Primary Care Provider within one week regarding your visit today; please call their office to schedule an appointment even if you are feeling better for a follow-up visit. You are referred to the monoclonal antibody infusion center.  They should call you in the next 1 to 2 days to schedule you an appointment if you qualify for infusion.  Please keep your phone on you.  Drink plenty water and get plenty of rest.  You may use over-the-counter anti-inflammatory such as Tylenol to help with your symptoms.  You may use the albuterol inhaler given to you today as needed if you develop wheezing.  If this medication does not seem to be helping then return to the ER.  Get help right away if: You have trouble breathing. You have pain or pressure in your chest. You have confusion. You have bluish lips and fingernails. You have difficulty waking from sleep. You have any new/concerning or worsening of symptoms. These symptoms may represent a serious problem that is an emergency. Do not wait to see if the symptoms will go away. Get medical help right away. Call your local emergency services (911 in the [place] Do not drive yourself to the hospital. Let the emergency medical personnel know if you think you have COVID-19.  Please read the additional information packets attached to your discharge summary.  Do not take your medicine if  develop an itchy rash, swelling in your mouth or lips, or difficulty breathing; call 911 and seek immediate emergency medical attention if this occurs.  You may review your lab tests and imaging results in their entirety on your MyChart account.  Please discuss  all results of fully with your primary care provider and other specialist at your follow-up visit.  Note: Portions of this text may have been transcribed using voice recognition software. Every effort was made to ensure accuracy; however, inadvertent computerized transcription errors may still be present.

## 2020-04-30 NOTE — ED Notes (Signed)
Radiology at bedside

## 2020-04-30 NOTE — ED Triage Notes (Signed)
Pt presents to ED with complaints of increased SOB started this am. Pt states he has had a fever up to 102.8, congestion started Monday night.

## 2020-05-01 ENCOUNTER — Telehealth: Payer: Self-pay | Admitting: Adult Health

## 2020-05-01 ENCOUNTER — Other Ambulatory Visit: Payer: Self-pay | Admitting: Adult Health

## 2020-05-01 DIAGNOSIS — U071 COVID-19: Secondary | ICD-10-CM

## 2020-05-01 NOTE — Progress Notes (Signed)
I connected by phone with Joseph Sanders on 05/01/2020 at 11:52 AM to discuss the potential use of a new treatment for mild to moderate COVID-19 viral infection in non-hospitalized patients.  This patient is a 29 y.o. male that meets the FDA criteria for Emergency Use Authorization of COVID monoclonal antibody casirivimab/imdevimab.  Has a (+) direct SARS-CoV-2 viral test result  Has mild or moderate COVID-19   Is NOT hospitalized due to COVID-19  Is within 10 days of symptom onset  Has at least one of the high risk factor(s) for progression to severe COVID-19 and/or hospitalization as defined in EUA.  Specific high risk criteria : BMI > 25   I have spoken and communicated the following to the patient or parent/caregiver regarding COVID monoclonal antibody treatment:  1. FDA has authorized the emergency use for the treatment of mild to moderate COVID-19 in adults and pediatric patients with positive results of direct SARS-CoV-2 viral testing who are 45 years of age and older weighing at least 40 kg, and who are at high risk for progressing to severe COVID-19 and/or hospitalization.  2. The significant known and potential risks and benefits of COVID monoclonal antibody, and the extent to which such potential risks and benefits are unknown.  3. Information on available alternative treatments and the risks and benefits of those alternatives, including clinical trials.  4. Patients treated with COVID monoclonal antibody should continue to self-isolate and use infection control measures (e.g., wear mask, isolate, social distance, avoid sharing personal items, clean and disinfect "high touch" surfaces, and frequent handwashing) according to CDC guidelines.   5. The patient or parent/caregiver has the option to accept or refuse COVID monoclonal antibody treatment.  After reviewing this information with the patient, The patient agreed to proceed with receiving casirivimab\imdevimab infusion and  will be provided a copy of the Fact sheet prior to receiving the infusion. [name] 05/01/2020 11:52 AM

## 2020-05-01 NOTE — Telephone Encounter (Signed)
Called and LMOM regarding monoclonal antibody treatment for COVID 19 given to those who are at risk for complications and/or hospitalization of the virus.  Patient meets criteria based on: BMI greater than 25.    Call back number given: [phone]  My chart message: unable to send  Pricella Gaugh, NP  

## 2020-05-03 ENCOUNTER — Ambulatory Visit (HOSPITAL_COMMUNITY)
Admission: RE | Admit: 2020-05-03 | Discharge: 2020-05-03 | Disposition: A | Discharge status: Home / Self Care | Payer: Commercial Managed Care - PPO | Source: Ambulatory Visit | Attending: Pulmonary Disease | Admitting: Pulmonary Disease

## 2020-05-03 DIAGNOSIS — U071 COVID-19: Secondary | ICD-10-CM

## 2020-05-03 MED ORDER — SODIUM CHLORIDE 0.9 % IV SOLN: INTRAVENOUS | Status: DC | PRN

## 2020-05-03 MED ORDER — METHYLPREDNISOLONE SODIUM SUCC 125 MG IJ SOLR: 125.0000 mg | Freq: Once | INTRAMUSCULAR | Status: DC | PRN

## 2020-05-03 MED ORDER — FAMOTIDINE IN NACL 20-0.9 MG/50ML-% IV SOLN: 20.0000 mg | Freq: Once | INTRAVENOUS | Status: DC | PRN

## 2020-05-03 MED ORDER — SODIUM CHLORIDE 0.9 % IV SOLN
1200.0000 mg | Freq: Once | INTRAVENOUS | Status: AC
  Administered 2020-05-03: 1200 mg via INTRAVENOUS
  Filled 2020-05-03: qty 800

## 2020-05-03 MED ORDER — ALBUTEROL SULFATE HFA 108 (90 BASE) MCG/ACT IN AERS: 2.0000 | INHALATION_SPRAY | Freq: Once | RESPIRATORY_TRACT | Status: DC | PRN

## 2020-05-03 MED ORDER — EPINEPHRINE 0.3 MG/0.3ML IJ SOAJ: 0.3000 mg | Freq: Once | INTRAMUSCULAR | Status: DC | PRN

## 2020-05-03 MED ORDER — DIPHENHYDRAMINE HCL 50 MG/ML IJ SOLN: 50.0000 mg | Freq: Once | INTRAMUSCULAR | Status: DC | PRN

## 2020-05-03 NOTE — Discharge Instructions (Signed)

## 2020-05-03 NOTE — Progress Notes (Signed)
  Diagnosis: COVID-19  Physician: Dr. Patrick Wright  Procedure: [facility] Infusion Clinic Med: casirivimab\imdevimab infusion - Provided patient with casirivimab\imdevimab fact sheet for patients, parents and caregivers prior to infusion.  Complications: No immediate complications noted.  Discharge: Discharged home   Irma Roulhac S Kashauna Celmer 05/03/2020  

## 2020-12-13 ENCOUNTER — Encounter (INDEPENDENT_AMBULATORY_CARE_PROVIDER_SITE_OTHER): Payer: Self-pay

## 2020-12-13 ENCOUNTER — Telehealth (INDEPENDENT_AMBULATORY_CARE_PROVIDER_SITE_OTHER): Payer: Self-pay

## 2020-12-13 NOTE — Telephone Encounter (Signed)
We can certainly set him a new patient appointment with me.

## 2020-12-13 NOTE — Telephone Encounter (Signed)
Patient called and left a detailed voice message about becoming a new patient with [place].   Called patient back and he stated that he has switched phone services and he thought his voice mail was still set up and apologized for not responding. Also patient has 2 different accounts and he has 2 MyCharts and is trying to get this straightened out. Not sure if you have a different message for this patient? He wants to discuss TRT testing and treatment and has been seen by Toni Amend who was with Dr. Margo Aye since he was 30 years old and she is no longer at the practice. I advised patient I would find out the status and we will call him back tomorrow. Please advise.

## 2020-12-14 NOTE — Telephone Encounter (Signed)
Clydie Braun called the patient and left a voice message to accept him as a new patient.

## 2021-01-24 ENCOUNTER — Other Ambulatory Visit: Payer: Self-pay

## 2021-01-24 ENCOUNTER — Encounter (INDEPENDENT_AMBULATORY_CARE_PROVIDER_SITE_OTHER): Payer: Self-pay | Admitting: Internal Medicine

## 2021-01-24 ENCOUNTER — Ambulatory Visit (INDEPENDENT_AMBULATORY_CARE_PROVIDER_SITE_OTHER): Payer: Commercial Managed Care - PPO | Admitting: Internal Medicine

## 2021-01-24 VITALS — BP 134/94 | HR 75 | Temp 97.9°F | Ht 71.0 in | Wt 190.8 lb

## 2021-01-24 DIAGNOSIS — Z125 Encounter for screening for malignant neoplasm of prostate: Secondary | ICD-10-CM | POA: Diagnosis not present

## 2021-01-24 DIAGNOSIS — E559 Vitamin D deficiency, unspecified: Secondary | ICD-10-CM | POA: Diagnosis not present

## 2021-01-24 DIAGNOSIS — R5381 Other malaise: Secondary | ICD-10-CM

## 2021-01-24 DIAGNOSIS — R6882 Decreased libido: Secondary | ICD-10-CM | POA: Diagnosis not present

## 2021-01-24 DIAGNOSIS — R5383 Other fatigue: Secondary | ICD-10-CM

## 2021-01-24 NOTE — Progress Notes (Signed)
Metrics: Intervention Frequency ACO  Documented Smoking Status Yearly  Screened one or more times in 24 months  Cessation Counseling or  Active cessation medication Past 24 months  Past 24 months   Guideline developer: UpToDate (See UpToDate for funding source) Date Released: 2014       Wellness Office Visit  Subjective:  Patient ID: Joseph Sanders, male    DOB: 03/03/1991  Age: 30 y.o. MRN: [id number]  CC: This 30 year old man comes to our practice as a new patient to establish care. HPI  He describes 1 year history of decreased libido.  He also describes many years history of fatigue constantly.  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  He has previously been treated with antidepressant medications which have not significantly helped him. He describes lack of concentration often.  He works as a Midwife. He exercises on a regular basis. Past Medical History:  Diagnosis Date  . History of kidney stones    2010   Past Surgical History:  Procedure Laterality Date  . INGUINAL HERNIA REPAIR Left 01/16/2017   Procedure: LEFT INGUINAL HERNIA REPAIR;  Surgeon: Emelia Loron, MD;  Location: [facility];  Service: General;  Laterality: Left;  . INSERTION OF MESH Left 01/16/2017   Procedure: INSERTION OF MESH;  Surgeon: Emelia Loron, MD;  Location: [facility];  Service: General;  Laterality: Left;  . removal of kidney stone    . wisdom teeth removal       History reviewed. No pertinent family history.  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   Social History   Tobacco Use  . Smoking status: Never Smoker  . Smokeless tobacco: Current User    Types: Snuff  Substance Use Topics  . Alcohol use: Yes    Comment: 1-2 drinks a month    Current Meds  Medication Sig  . Ashwagandha 500 MG CAPS Take 500 mg by  mouth daily.  . Cholecalciferol (VITAMIN D3) 125 MCG (5000 UT) CAPS Take 10,000 capsules by mouth daily.  . COLLAGEN PO Take by mouth. 4 capsules daily (peptides)  . CREATINE PO Take by mouth. Takes 3 grams daily  . Multiple Vitamin (MULTIVITAMIN) tablet Take 1 tablet by mouth daily.  . Omega-3 Fatty Acids (FISH OIL PO) Take by mouth.  . vitamin C (ASCORBIC ACID) 500 MG tablet Take 500 mg by mouth daily.  . Whey Protein POWD Take by mouth.     Flowsheet Row Office Visit from 01/24/2021 in [facility]  PHQ-9 Total Score 0      Objective:   Today's Vitals: BP (!) 134/94   Pulse 75   Temp 97.9 F (36.6 C) (Temporal)   Ht 5\' 11"  (1.803 m)   Wt 190 lb 12.8 oz (86.5 kg)   SpO2 95%   BMI 26.61 kg/m  Vitals with BMI 01/24/2021 05/03/2020 05/03/2020  Height 5\' 11"  - -  Weight 190 lbs 13 oz - -  BMI 26.62 - -  Systolic 134 112 07/03/2020  Diastolic 94 74 73  Pulse 75 62 78     Physical Exam   He looks systemically well.  Muscular.    Assessment   1. Malaise and fatigue   2. Decreased libido   3. Special screening for malignant neoplasm of prostate   4. Vitamin D deficiency disease  Tests ordered Orders Placed This Encounter  Procedures  . T3, free  . T4, free  . TSH  . Testosterone Total,Free,Bio, Males  . PSA, Total with Reflex to PSA, Free  . DHEA-sulfate  . CBC  . COMPLETE METABOLIC PANEL WITH GFR  . VITAMIN D 25 Hydroxy (Vit-D Deficiency, Fractures)     Plan: 1. Blood work is ordered. 2. I will see him in the next several weeks to discuss all his results and further recommendations.   No orders of the defined types were placed in this encounter.   Wilson Singer, MD

## 2021-01-25 LAB — COMPLETE METABOLIC PANEL WITH GFR
AG Ratio: 1.8 (calc) (ref 1.0–2.5)
ALT: 30 U/L (ref 9–46)
AST: 44 U/L — ABNORMAL HIGH (ref 10–40)
Albumin: 4.4 g/dL (ref 3.6–5.1)
Alkaline phosphatase (APISO): 43 U/L (ref 36–130)
BUN: 17 mg/dL (ref 7–25)
CO2: 29 mmol/L (ref 20–32)
Calcium: 9.2 mg/dL (ref 8.6–10.3)
Chloride: 101 mmol/L (ref 98–110)
Creat: 1.13 mg/dL (ref 0.60–1.35)
GFR, Est African American: 101 mL/min/{1.73_m2} (ref >=60)
GFR, Est Non African American: 87 mL/min/{1.73_m2} (ref >=60)
Globulin: 2.4 g/dL (calc) (ref 1.9–3.7)
Glucose, Bld: 89 mg/dL (ref 65–139)
Potassium: 3.9 mmol/L (ref 3.5–5.3)
Sodium: 138 mmol/L (ref 135–146)
Total Bilirubin: 0.7 mg/dL (ref 0.2–1.2)
Total Protein: 6.8 g/dL (ref 6.1–8.1)

## 2021-01-25 LAB — CBC
HCT: 43.6 % (ref 38.5–50.0)
Hemoglobin: 14.6 g/dL (ref 13.2–17.1)
MCH: 32.3 pg (ref 27.0–33.0)
MCHC: 33.5 g/dL (ref 32.0–36.0)
MCV: 96.5 fL (ref 80.0–100.0)
MPV: 9.4 fL (ref 7.5–12.5)
Platelets: 195 10*3/uL (ref 140–400)
RBC: 4.52 10*6/uL (ref 4.20–5.80)
RDW: 12.4 % (ref 11.0–15.0)
WBC: 6.2 10*3/uL (ref 3.8–10.8)

## 2021-01-25 LAB — TESTOSTERONE TOTAL,FREE,BIO, MALES
Albumin: 4.4 g/dL (ref 3.6–5.1)
Sex Hormone Binding: 37 nmol/L (ref 10–50)
Testosterone, Bioavailable: 130.4 ng/dL (ref 110.0–575.0)
Testosterone, Free: 64.8 pg/mL (ref 46.0–224.0)
Testosterone: 522 ng/dL (ref 250–827)

## 2021-01-25 LAB — DHEA-SULFATE: DHEA-SO4: 293 ug/dL (ref 74–617)

## 2021-01-25 LAB — PSA, TOTAL WITH REFLEX TO PSA, FREE: PSA, Total: 0.7 ng/mL (ref <=4.0)

## 2021-01-25 LAB — VITAMIN D 25 HYDROXY (VIT D DEFICIENCY, FRACTURES): Vit D, 25-Hydroxy: 88 ng/mL (ref 30–100)

## 2021-01-25 LAB — TSH: TSH: 1.65 mIU/L (ref 0.40–4.50)

## 2021-01-25 LAB — T3, FREE: T3, Free: 3.5 pg/mL (ref 2.3–4.2)

## 2021-01-25 LAB — T4, FREE: Free T4: 1.3 ng/dL (ref 0.8–1.8)

## 2021-01-30 ENCOUNTER — Encounter (INDEPENDENT_AMBULATORY_CARE_PROVIDER_SITE_OTHER): Payer: Self-pay | Admitting: Internal Medicine

## 2021-02-01 ENCOUNTER — Other Ambulatory Visit: Payer: Self-pay

## 2021-02-01 ENCOUNTER — Encounter (INDEPENDENT_AMBULATORY_CARE_PROVIDER_SITE_OTHER): Payer: Self-pay | Admitting: Internal Medicine

## 2021-02-01 ENCOUNTER — Ambulatory Visit (INDEPENDENT_AMBULATORY_CARE_PROVIDER_SITE_OTHER): Payer: Commercial Managed Care - PPO | Admitting: Internal Medicine

## 2021-02-01 VITALS — BP 128/70 | HR 87 | Temp 97.9°F | Resp 18 | Ht 71.0 in | Wt 190.0 lb

## 2021-02-01 DIAGNOSIS — R5381 Other malaise: Secondary | ICD-10-CM

## 2021-02-01 DIAGNOSIS — E559 Vitamin D deficiency, unspecified: Secondary | ICD-10-CM | POA: Diagnosis not present

## 2021-02-01 DIAGNOSIS — R5383 Other fatigue: Secondary | ICD-10-CM | POA: Diagnosis not present

## 2021-02-01 DIAGNOSIS — R6882 Decreased libido: Secondary | ICD-10-CM

## 2021-02-01 MED ORDER — TESTOSTERONE CYPIONATE 200 MG/ML IM SOLN
100.0000 mg | INTRAMUSCULAR | 0 refills | Status: DC
Start: 2021-02-01 — End: 2021-04-10

## 2021-02-01 NOTE — Progress Notes (Signed)
Metrics: Intervention Frequency ACO  Documented Smoking Status Yearly  Screened one or more times in 24 months  Cessation Counseling or  Active cessation medication Past 24 months  Past 24 months   Guideline developer: UpToDate (See UpToDate for funding source) Date Released: 2014       Wellness Office Visit  Subjective:  Patient ID: Joseph Sanders, male    DOB: 1991/08/26  Age: 30 y.o. MRN: [id number]  CC: This man comes in to follow-up on his blood work and further recommendations. HPI  He describes extreme fatigue, lack of concentration and focus and significantly reduced libido when I saw him on the last visit. His vitamin D levels are optimal and he takes vitamin D3 10,000 units daily. Your testosterone levels are in the normal range.  His free testosterone level is towards the lower end. Remaining blood work is unremarkable Past Medical History:  Diagnosis Date  . History of kidney stones    2010   Past Surgical History:  Procedure Laterality Date  . INGUINAL HERNIA REPAIR Left 01/16/2017   Procedure: LEFT INGUINAL HERNIA REPAIR;  Surgeon: Emelia Loron, MD;  Location: [facility];  Service: General;  Laterality: Left;  . INSERTION OF MESH Left 01/16/2017   Procedure: INSERTION OF MESH;  Surgeon: Emelia Loron, MD;  Location: [facility];  Service: General;  Laterality: Left;  . removal of kidney stone    . wisdom teeth removal       History reviewed. No pertinent family history.  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   Social History   Tobacco Use  . Smoking status: Never Smoker  . Smokeless tobacco: Current User    Types: Snuff  Substance Use Topics  . Alcohol use: Yes    Comment: 1-2 drinks a month    Current Meds  Medication Sig  . Ashwagandha 500 MG CAPS Take 500 mg by mouth daily.  . Cholecalciferol (VITAMIN D3) 125 MCG (5000  UT) CAPS Take 10,000 capsules by mouth daily.  . COLLAGEN PO Take by mouth. 4 capsules daily (peptides)  . CREATINE PO Take by mouth. Takes 3 grams daily  . Multiple Vitamin (MULTIVITAMIN) tablet Take 1 tablet by mouth daily.  . Omega-3 Fatty Acids (FISH OIL PO) Take by mouth.  . testosterone cypionate (DEPO-TESTOSTERONE) 200 MG/ML injection Inject 0.5 mLs (100 mg total) into the muscle every 7 (seven) days.  . vitamin C (ASCORBIC ACID) 500 MG tablet Take 500 mg by mouth daily.  . Whey Protein POWD Take by mouth.     Flowsheet Row Office Visit from 01/24/2021 in [facility]  PHQ-9 Total Score 0      Objective:   Today's Vitals: BP 128/70 (BP Location: Right Arm, Patient Position: Sitting, Cuff Size: Normal)   Pulse 87   Temp 97.9 F (36.6 C) (Temporal)   Resp 18   Ht 5\' 11"  (1.803 m)   Wt 190 lb (86.2 kg)   SpO2 99%   BMI 26.50 kg/m  Vitals with BMI 02/01/2021 01/24/2021 05/03/2020  Height 5\' 11"  5\' 11"  -  Weight 190 lbs 190 lbs 13 oz -  BMI 26.51 26.62 -  Systolic 128 134 07/03/2020  Diastolic 70 94 74  Pulse 87 75 62     Physical Exam   He looks systemically well.  No new physical findings today.    Assessment   1. Decreased libido  2. Vitamin D deficiency disease   3. Malaise and fatigue       Tests ordered No orders of the defined types were placed in this encounter.    Plan: 1. He will continue with vitamin D3 10,000 units daily for vitamin D deficiency. 2. I had a long discussion with him regarding his testosterone levels and testosterone therapy.  Although his levels are in the normal range, his free testosterone is towards the lower end of normal and he definitely appears to have significant symptoms relating to deficiency of testosterone relative to normal for him.  I also counseled him regarding testosterone in terms of possibility of permanent infertility and he is absolutely sure that he does not want to father any more children.  I discussed the  FDA warnings, pros and cons, benefits and possible side effects and mode of administration.  He would like to proceed and he has given me verbal consent after I given him all this information above.  I have sent a prescription of testosterone cypionate injectable and he will start with 100 mg intramuscular once a week.  I instructed him on method of administration.  I spent approximately 30 minutes discussing testosterone therapy with him today. 3. Follow-up in about 4 to 6 weeks time to see how he is doing.   Meds ordered this encounter  Medications  . testosterone cypionate (DEPO-TESTOSTERONE) 200 MG/ML injection    Sig: Inject 0.5 mLs (100 mg total) into the muscle every 7 (seven) days.    Dispense:  10 mL    Refill:  0    Joseph Sheppard Normajean Glasgow, MD

## 2021-02-23 ENCOUNTER — Ambulatory Visit (INDEPENDENT_AMBULATORY_CARE_PROVIDER_SITE_OTHER): Payer: Commercial Managed Care - PPO | Admitting: Internal Medicine

## 2021-03-16 ENCOUNTER — Encounter (INDEPENDENT_AMBULATORY_CARE_PROVIDER_SITE_OTHER): Payer: Self-pay | Admitting: Internal Medicine

## 2021-03-16 ENCOUNTER — Other Ambulatory Visit: Payer: Self-pay

## 2021-03-16 ENCOUNTER — Ambulatory Visit (INDEPENDENT_AMBULATORY_CARE_PROVIDER_SITE_OTHER): Payer: Commercial Managed Care - PPO | Admitting: Internal Medicine

## 2021-03-16 VITALS — BP 130/80 | HR 87 | Temp 97.2°F | Resp 18 | Ht 71.0 in | Wt 195.2 lb

## 2021-03-16 DIAGNOSIS — R6882 Decreased libido: Secondary | ICD-10-CM

## 2021-03-16 NOTE — Progress Notes (Signed)
Metrics: Intervention Frequency ACO  Documented Smoking Status Yearly  Screened one or more times in 24 months  Cessation Counseling or  Active cessation medication Past 24 months  Past 24 months   Guideline developer: UpToDate (See UpToDate for funding source) Date Released: 2014       Wellness Office Visit  Subjective:  Patient ID: Joseph Sanders, male    DOB: [birth year]  Age: 30 y.o. MRN: [id number]  CC: This man comes in for follow-up regarding his testosterone therapy. HPI  Since starting testosterone which was over 5 weeks ago, he feels no different, does not feel any benefit yet.  He has had no adverse side effects also. Past Medical History:  Diagnosis Date   History of kidney stones    2010   Past Surgical History:  Procedure Laterality Date   INGUINAL HERNIA REPAIR Left 01/16/2017   Procedure: LEFT INGUINAL HERNIA REPAIR;  Surgeon: Emelia Loron, MD;  Location: [facility];  Service: General;  Laterality: Left;   INSERTION OF MESH Left 01/16/2017   Procedure: INSERTION OF MESH;  Surgeon: Emelia Loron, MD;  Location: [facility];  Service: General;  Laterality: Left;   removal of kidney stone     wisdom teeth removal       History reviewed. No pertinent family history.  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   Social History   Tobacco Use   Smoking status: Never   Smokeless tobacco: Current    Types: Snuff  Substance Use Topics   Alcohol use: Yes    Comment: 1-2 drinks a month    Current Meds  Medication Sig   Ashwagandha 500 MG CAPS Take 500 mg by mouth daily.   Cholecalciferol (VITAMIN D3) 125 MCG (5000 UT) CAPS Take 10,000 capsules by mouth daily.   COLLAGEN PO Take by mouth. 4 capsules daily (peptides)   CREATINE PO Take by mouth. Takes 3 grams daily   Magnesium 400 MG TABS    Multiple Vitamin (MULTIVITAMIN) tablet Take 1  tablet by mouth daily.   Omega-3 Fatty Acids (FISH OIL PO) Take by mouth.   testosterone cypionate (DEPO-TESTOSTERONE) 200 MG/ML injection Inject 0.5 mLs (100 mg total) into the muscle every 7 (seven) days.   TUBERCULIN SYR 1CC/25GX5/8" 25G X 5/8" 1 ML MISC    vitamin C (ASCORBIC ACID) 500 MG tablet Take 500 mg by mouth daily.   Whey Protein POWD Take by mouth.     Flowsheet Row Office Visit from 01/24/2021 in [facility]  PHQ-9 Total Score 0       Objective:   Today's Vitals: BP 130/80 (BP Location: Right Arm, Patient Position: Sitting, Cuff Size: Normal)   Pulse 87   Temp (!) 97.2 F (36.2 C) (Temporal)   Resp 18   Ht 5\' 11"  (1.803 m)   Wt 195 lb 3.2 oz (88.5 kg)   SpO2 98%   BMI 27.22 kg/m  Vitals with BMI 03/16/2021 02/01/2021 01/24/2021  Height 5\' 11"  5\' 11"  5\' 11"   Weight 195 lbs 3 oz 190 lbs 190 lbs 13 oz  BMI 27.24 26.51 26.62  Systolic 130 128 03/26/2021  Diastolic 80 70 94  Pulse 87 87 75     Physical Exam  He looks systemically well.  No new physical findings.     Assessment   1. Decreased libido       Tests ordered No  orders of the defined types were placed in this encounter.    Plan: 1.  I have instructed him to increase the dose of testosterone so that he will be taking 100 mg intramuscular twice a week now. 2.  Follow-up in about 4 to 6 weeks time to see how he is doing and we will check levels at that time.    No orders of the defined types were placed in this encounter.   Wilson Singer, MD

## 2021-04-10 ENCOUNTER — Encounter (INDEPENDENT_AMBULATORY_CARE_PROVIDER_SITE_OTHER): Payer: Self-pay | Admitting: Internal Medicine

## 2021-04-10 ENCOUNTER — Other Ambulatory Visit: Payer: Self-pay

## 2021-04-10 ENCOUNTER — Ambulatory Visit (INDEPENDENT_AMBULATORY_CARE_PROVIDER_SITE_OTHER): Payer: Commercial Managed Care - PPO | Admitting: Internal Medicine

## 2021-04-10 VITALS — BP 130/84 | HR 68 | Temp 97.9°F | Ht 71.0 in | Wt 195.6 lb

## 2021-04-10 DIAGNOSIS — R6882 Decreased libido: Secondary | ICD-10-CM

## 2021-04-10 DIAGNOSIS — R5383 Other fatigue: Secondary | ICD-10-CM | POA: Diagnosis not present

## 2021-04-10 DIAGNOSIS — R5381 Other malaise: Secondary | ICD-10-CM

## 2021-04-10 MED ORDER — TESTOSTERONE CYPIONATE 200 MG/ML IM SOLN: 100.0000 mg | INTRAMUSCULAR | 2 refills | Status: AC

## 2021-04-10 NOTE — Progress Notes (Signed)
Metrics: Intervention Frequency ACO  Documented Smoking Status Yearly  Screened one or more times in 24 months  Cessation Counseling or  Active cessation medication Past 24 months  Past 24 months   Guideline developer: UpToDate (See UpToDate for funding source) Date Released: 2014       Wellness Office Visit  Subjective:  Patient ID: Joseph Sanders, male    DOB: 09/25/1990  Age: 30 y.o. MRN: [id number]  CC: This man comes in for follow up of testosterone therapy. HPI  He feels that his sex drive has improved but his energy levels have not.  He is now taking testosterone 100 mg intramuscular twice a week. Past Medical History:  Diagnosis Date   History of kidney stones    2010   Past Surgical History:  Procedure Laterality Date   INGUINAL HERNIA REPAIR Left 01/16/2017   Procedure: LEFT INGUINAL HERNIA REPAIR;  Surgeon: Emelia Loron, MD;  Location: [facility];  Service: General;  Laterality: Left;   INSERTION OF MESH Left 01/16/2017   Procedure: INSERTION OF MESH;  Surgeon: Emelia Loron, MD;  Location: [facility];  Service: General;  Laterality: Left;   removal of kidney stone     wisdom teeth removal       History reviewed. No pertinent family history.  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   Social History   Tobacco Use   Smoking status: Never   Smokeless tobacco: Current    Types: Snuff  Substance Use Topics   Alcohol use: Yes    Comment: 1-2 drinks a month    Current Meds  Medication Sig   Ashwagandha 500 MG CAPS Take 500 mg by mouth daily.   Cholecalciferol (VITAMIN D3) 125 MCG (5000 UT) CAPS Take 10,000 capsules by mouth daily.   COLLAGEN PO Take by mouth. 4 capsules daily (peptides)   CREATINE PO Take by mouth. Takes 3 grams daily   Magnesium 400 MG TABS    Multiple Vitamin (MULTIVITAMIN) tablet  Take 1 tablet by mouth daily.   Omega-3 Fatty Acids (FISH OIL PO) Take by mouth.   TUBERCULIN SYR 1CC/25GX5/8" 25G X 5/8" 1 ML MISC    vitamin C (ASCORBIC ACID) 500 MG tablet Take 500 mg by mouth daily.   Whey Protein POWD Take by mouth.   [DISCONTINUED] testosterone cypionate (DEPO-TESTOSTERONE) 200 MG/ML injection Inject 0.5 mLs (100 mg total) into the muscle every 7 (seven) days. (Patient taking differently: Inject 100 mg into the muscle 2 (two) times a week.)     Flowsheet Row Office Visit from 01/24/2021 in [facility]  PHQ-9 Total Score 0       Objective:   Today's Vitals: BP 130/84   Pulse 68   Temp 97.9 F (36.6 C) (Temporal)   Ht 5\' 11"  (1.803 m)   Wt 195 lb 9.6 oz (88.7 kg)   SpO2 95%   BMI 27.28 kg/m  Vitals with BMI 04/10/2021 03/16/2021 02/01/2021  Height 5\' 11"  5\' 11"  5\' 11"   Weight 195 lbs 10 oz 195 lbs 3 oz 190 lbs  BMI 27.29 27.24 26.51  Systolic 130 130 04/03/2021  Diastolic 84 80 70  Pulse 68 87 87     Physical Exam  He looks systemically well.  No new physical findings.     Assessment   1. Decreased libido   2. Malaise and fatigue  Tests ordered Orders Placed This Encounter  Procedures   Testosterone Total,Free,Bio, Males      Plan: 1.  He took testosterone injection this morning.  We will check a level today.  I may need to adjust upwards the dose again. 2.  I discussed his DHEA levels and they are in the normal range but could be improved so I would recommend he start taking DHEA 50 mg every morning. 3.  Follow-up in about 2 months    Meds ordered this encounter  Medications   testosterone cypionate (DEPO-TESTOSTERONE) 200 MG/ML injection    Sig: Inject 0.5 mLs (100 mg total) into the muscle 2 (two) times a week.    Dispense:  10 mL    Refill:  2     Talisha Erby Normajean Glasgow, MD

## 2021-04-11 LAB — TESTOSTERONE TOTAL,FREE,BIO, MALES
Albumin: 4.3 g/dL (ref 3.6–5.1)
Sex Hormone Binding: 25 nmol/L (ref 10–50)
Testosterone, Bioavailable: 359.3 ng/dL (ref 110.0–575.0)
Testosterone, Free: 182.4 pg/mL (ref 46.0–224.0)
Testosterone: 918 ng/dL — ABNORMAL HIGH (ref 250–827)

## 2021-04-24 ENCOUNTER — Encounter (INDEPENDENT_AMBULATORY_CARE_PROVIDER_SITE_OTHER): Payer: Self-pay | Admitting: Internal Medicine

## 2021-04-25 ENCOUNTER — Other Ambulatory Visit (INDEPENDENT_AMBULATORY_CARE_PROVIDER_SITE_OTHER): Payer: Self-pay | Admitting: Nurse Practitioner

## 2021-04-27 IMAGING — DX DG CHEST 1V PORT
1 series · 1 of 1 positions shown · non-contrast
Comparison: None.

CLINICAL DATA: Cough, shortness of breath and fever.

EXAM:
PORTABLE CHEST 1 VIEW

[chest ap]
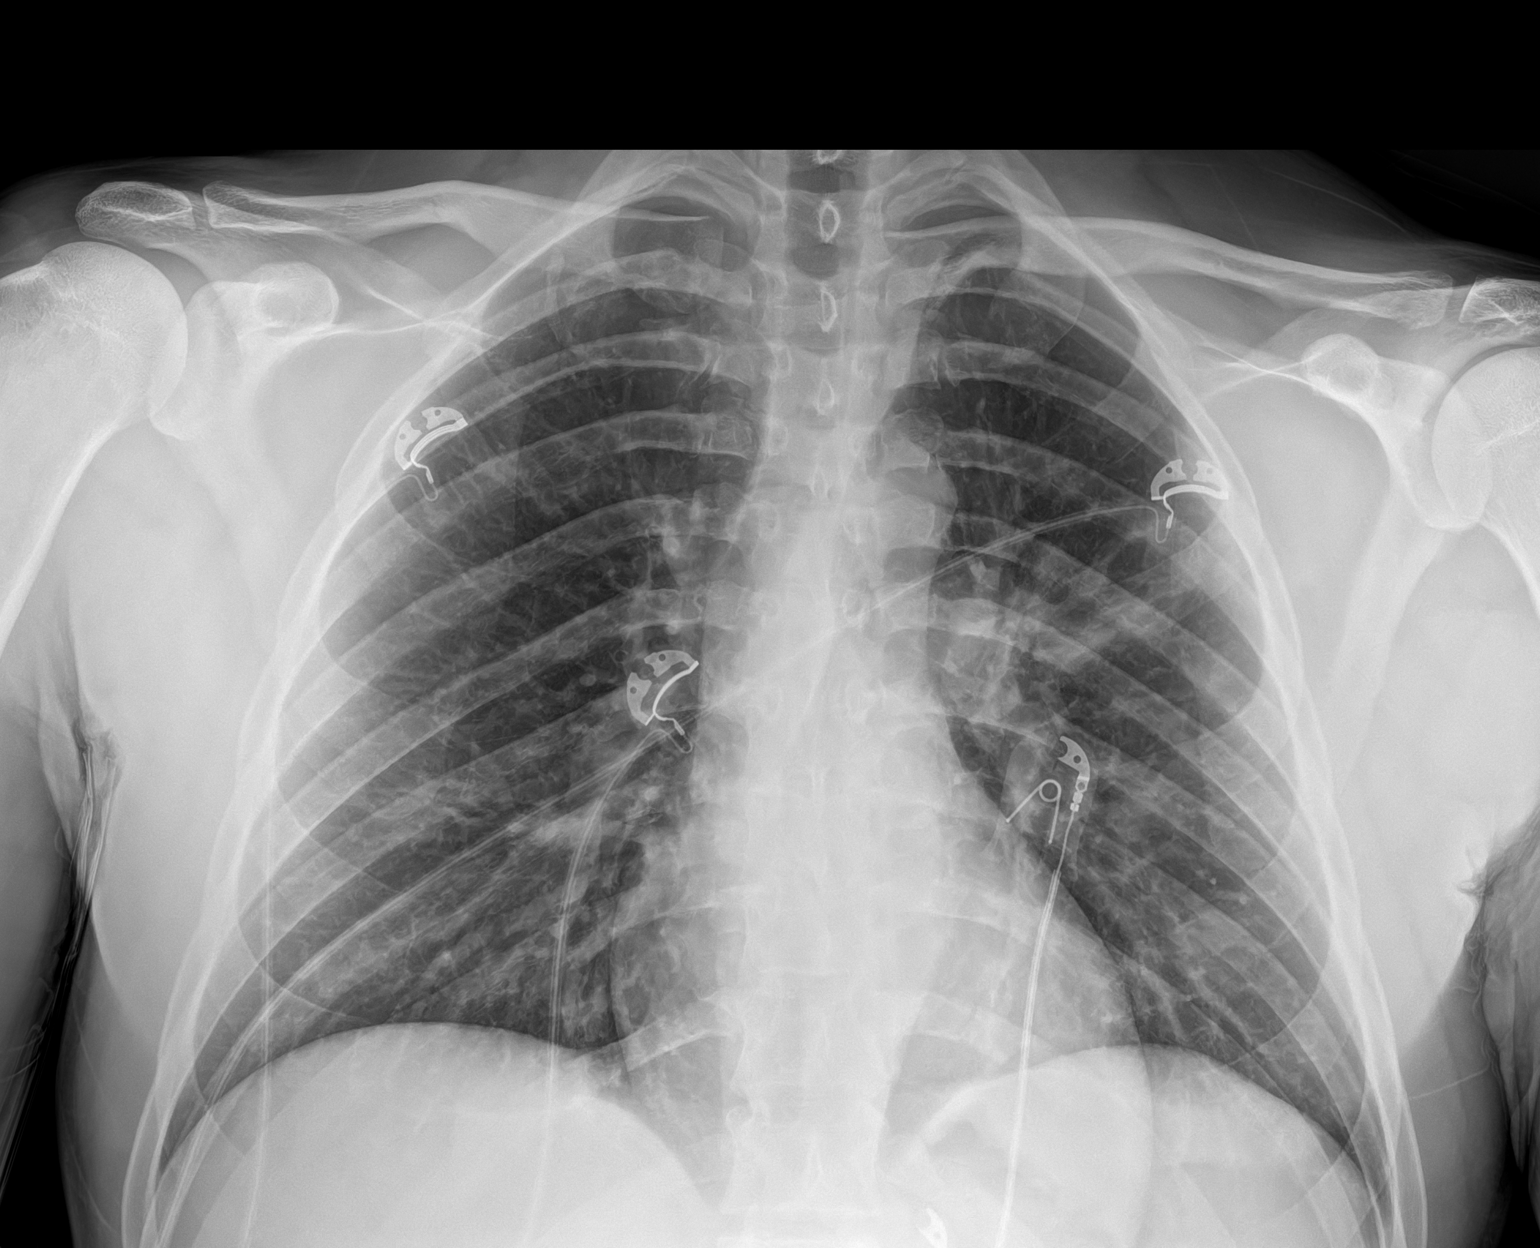

[1 of 1 positions shown; findings below may reference images not displayed]

FINDINGS: Mild left perihilar atelectasis and/or infiltrate is seen. There is
no evidence of a pleural effusion or pneumothorax. The heart size
and mediastinal contours are within normal limits. The visualized
skeletal structures are unremarkable.
IMPRESSION: Mild left perihilar atelectasis and/or infiltrate.

## 2021-06-13 ENCOUNTER — Ambulatory Visit (INDEPENDENT_AMBULATORY_CARE_PROVIDER_SITE_OTHER): Payer: Commercial Managed Care - PPO | Admitting: Internal Medicine

## 2022-12-10 ENCOUNTER — Telehealth: Payer: Self-pay

## 2022-12-10 NOTE — Telephone Encounter (Signed)
Pt was made aware and verbalized understanding.  

## 2022-12-10 NOTE — Telephone Encounter (Signed)
Patient called this morning stating that he has a hemorrhoid that is bleeding and is out of control. Pt states that he is a family friend and that he was told by you to call this morning to be put on the schedule. Pt has not been seen here before and has not had a referral sent in by pcp. How would you like for [place] to handle this pt? You're first available appt isnt until late April. Please advise.

## 2022-12-11 ENCOUNTER — Encounter: Payer: Self-pay | Admitting: Gastroenterology

## 2022-12-11 ENCOUNTER — Ambulatory Visit: Payer: 59 | Admitting: Gastroenterology

## 2022-12-11 VITALS — BP 139/90 | HR 73 | Temp 97.9°F | Ht 72.0 in | Wt 203.8 lb

## 2022-12-11 DIAGNOSIS — K645 Perianal venous thrombosis: Secondary | ICD-10-CM | POA: Insufficient documentation

## 2022-12-11 MED ORDER — HYDROCORTISONE (PERIANAL) 2.5 % EX CREA: 1.0000 | TOPICAL_CREAM | Freq: Four times a day (QID) | CUTANEOUS | 1 refills | Status: DC

## 2022-12-11 NOTE — Patient Instructions (Signed)
I have sent in a stronger steroid cream to apply topically 4 times a day. Continue to use the lidocaine as needed.  Avoid straining, limit toilet time to 2-3 minutes. You can keep doing the sitz baths. You will probably still see some bleeding.   Over the next few days, things should start getting better. Please let me know if no improvement in next 24-48 hours.   I would add Miralax 1 capful in 8 ounces of water a day. Keep drinking plenty of water. We want to keep your bowel movements soft.   I would like to see you in 4-6 weeks. We can do a little peek inside and see if any internal hemorrhoids could be causing some of these issues, too.   It was a pleasure to see you today. I want to create trusting relationships with patients and provide genuine, compassionate, and quality care. I truly value your feedback, so please be on the lookout for a survey regarding your visit with me today. I appreciate your time in completing this!    Annitta Needs, PhD, ANP-BC [facility]

## 2022-12-11 NOTE — Progress Notes (Signed)
Gastroenterology Office Note    Referring Provider: Oneta Rack Fami* Primary GI: Dr. Gala Romney    Chief Complaint   Chief Complaint  Patient presents with   New Patient (Initial Visit)    Hemorrhoids pain     History of Present Illness   Joseph Sanders is a 32 y.o. male presenting today at the request of Urgent Care due to thrombosed hemorrhoid.   History of intermittent hemorrhoid flares. Over past 10 years. Virus last week with N/V/D, then had external hemorrhoid flare starting last Wednesday. Bleeding started yesterday. No straining. 3 dulcolax daily. Lidcaoine has helped. Prep h didn't help. Feels uncomfortable with walking and sitting. Works as an Clinical biochemist. Job requirements have made it more difficult to move without discomfort. Felt better with lidocaine but still in pain.     Past Medical History:  Diagnosis Date   History of kidney stones    2010    Past Surgical History:  Procedure Laterality Date   INGUINAL HERNIA REPAIR Left 01/16/2017   Procedure: LEFT INGUINAL HERNIA REPAIR;  Surgeon: Rolm Bookbinder, MD;  Location: [address];  Service: General;  Laterality: Left;   INSERTION OF MESH Left 01/16/2017   Procedure: INSERTION OF MESH;  Surgeon: Rolm Bookbinder, MD;  Location: [place];  Service: General;  Laterality: Left;   removal of kidney stone     wisdom teeth removal      Current Outpatient Medications  Medication Sig Dispense Refill   Ashwagandha 500 MG CAPS Take 500 mg by mouth daily.     Cholecalciferol (VITAMIN D3) 125 MCG (5000 UT) CAPS Take 10,000 capsules by mouth daily.     CREATINE PO Take by mouth. Takes 3 grams daily     Magnesium 400 MG TABS      Multiple Vitamin (MULTIVITAMIN) tablet Take 1 tablet by mouth daily.     Omega-3 Fatty Acids (FISH OIL PO) Take by mouth.     testosterone cypionate (DEPO-TESTOSTERONE) 200 MG/ML injection Inject 0.5 mLs (100 mg total) into the muscle 2 (two) times a  week. 10 mL 2   TUBERCULIN SYR 1CC/25GX5/8" 25G X 5/8" 1 ML MISC      vitamin C (ASCORBIC ACID) 500 MG tablet Take 500 mg by mouth daily.     Whey Protein POWD Take by mouth.     COLLAGEN PO Take by mouth. 4 capsules daily (peptides) (Patient not taking: Reported on 12/11/2022)     NONFORMULARY OR COMPOUNDED ITEM Apply 4 sprays topically daily. 6% Minoxidil and 0.3% topical Finasteride  (Patient not taking: Reported on 12/11/2022)     No current facility-administered medications for this visit.    Allergies as of 12/11/2022 - Review Complete 12/11/2022  Allergen Reaction Noted   Penicillins Hives and Other (See Comments) 01/09/2017   Sulfa antibiotics Hives 01/09/2017    No family history on file.  Social History   Socioeconomic History   Marital status: Married    Spouse name: Not on file   Number of children: Not on file   Years of education: Not on file   Highest education level: Not on file  Occupational History   Not on file  Tobacco Use   Smoking status: Never   Smokeless tobacco: Current    Types: Snuff  Vaping Use   Vaping Use: Never used  Substance and Sexual Activity   Alcohol use: Yes    Comment: 1-2 drinks a month   Drug use: No  Sexual activity: Yes  Other Topics Concern   Not on file  Social History Narrative   Married since 2013.Lives with wife and 3 kids.Previously Quarry manager.Ex-marine,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   Social Determinants of Health   Financial Resource Strain: Not on file  Food Insecurity: Not on file  Transportation Needs: Not on file  Physical Activity: Not on file  Stress: Not on file  Social Connections: Not on file  Intimate Partner Violence: Not on file     Review of Systems   Gen: Denies any fever, chills, fatigue, weight loss, lack of appetite.  CV: Denies chest pain, heart palpitations, peripheral edema, syncope.  Resp: Denies shortness of breath at rest or with exertion. Denies wheezing or cough.  GI: Denies  dysphagia or odynophagia. Denies jaundice, hematemesis, fecal incontinence. GU : Denies urinary burning, urinary frequency, urinary hesitancy MS: Denies joint pain, muscle weakness, cramps, or limitation of movement.  Derm: Denies rash, itching, dry skin Psych: Denies depression, anxiety, memory loss, and confusion Heme: Denies bruising, bleeding, and enlarged lymph nodes.   Physical Exam   BP (!) 139/90   Pulse 73   Temp 97.9 F (36.6 C)   Ht 6' (1.829 m)   Wt 203 lb 12.8 oz (92.4 kg)   BMI 27.64 kg/m  General:   Alert and oriented. Pleasant and cooperative. Well-nourished and well-developed.  Head:  Normocephalic and atraumatic. Eyes:  Without icterus Ears:  Normal auditory acuity. Rectal:  thrombosed left lateral external hemorrhoid, about size of pecan, 2 small ulcerations with clot attached.  Msk:  Symmetrical without gross deformities. Normal posture. Extremities:  Without edema. Neurologic:  Alert and  oriented x4;  grossly normal neurologically. Skin:  Intact without significant lesions or rashes. Psych:  Alert and cooperative. Normal mood and affect.   Assessment   Joseph Sanders is a 32 y.o. male presenting today with with external thrombosed hemorrhoid.  Symptoms starting last Wednesday, and he is outside of the window to be appropriate for surgical evaluation/lancing. Clot has already started to resorb.   Will treat symptomatically with hydrocortisone cream QID, lidocaine in between, additional supportive measures discussed.   Recommending Miralax daily to keep stool soft.   We will see him back in 4-6 weeks for reassessment. Plan for anoscopy to evaluate for internal hemorrhoids as well. From his history, he seems to have dealt with this for many years.     PLAN   Anusol and lidocaine Miralax daily Supportive measures Call if no improvement in next 24-48 hours 4-6 week follow-up  Annitta Needs, PhD, ANP-BC [facility]

## 2023-01-10 ENCOUNTER — Encounter: Payer: Self-pay | Admitting: Gastroenterology

## 2023-01-10 ENCOUNTER — Ambulatory Visit (INDEPENDENT_AMBULATORY_CARE_PROVIDER_SITE_OTHER): Payer: 59 | Admitting: Gastroenterology

## 2023-01-10 VITALS — BP 133/91 | HR 80 | Temp 98.4°F | Ht 72.0 in | Wt 202.5 lb

## 2023-01-10 DIAGNOSIS — K645 Perianal venous thrombosis: Secondary | ICD-10-CM | POA: Diagnosis not present

## 2023-01-10 NOTE — Progress Notes (Signed)
Gastroenterology Office Note     Primary Care Physician:  Patient, No Pcp Per  Primary Gastroenterologist: Dr. Jena Gauss    Chief Complaint   Chief Complaint  Patient presents with   Follow-up    Doing better     History of Present Illness   Joseph Sanders is a 32 y.o. male presenting today in follow-up with a history of thrombosed external hemorrhoid in March 2024.  He was initially seen in March 2024 with painful flare. He was outside the window for surgical intervention. We provided supportive topical creams. He returns today with complete resolution of pain, swelling. He is taking 1-2 stool softeners a day depending on his oral intake. No straining. He would like to hold off on internal hemorrhoid banding at this time.        Past Medical History:  Diagnosis Date   History of kidney stones    2010    Past Surgical History:  Procedure Laterality Date   INGUINAL HERNIA REPAIR Left 01/16/2017   Procedure: LEFT INGUINAL HERNIA REPAIR;  Surgeon: Emelia Loron, MD;  Location: [facility];  Service: General;  Laterality: Left;   INSERTION OF MESH Left 01/16/2017   Procedure: INSERTION OF MESH;  Surgeon: Emelia Loron, MD;  Location: [facility];  Service: General;  Laterality: Left;   removal of kidney stone     wisdom teeth removal      Current Outpatient Medications  Medication Sig Dispense Refill   anastrozole (ARIMIDEX) 1 MG tablet Take 1 mg by mouth once a week.     Cholecalciferol (VITAMIN D3) 125 MCG (5000 UT) CAPS Take 10,000 capsules by mouth daily.     CREATINE PO Take by mouth. Takes 3 grams daily     Magnesium 400 MG TABS      Multiple Vitamin (MULTIVITAMIN) tablet Take 1 tablet by mouth daily.     Omega-3 Fatty Acids (FISH OIL PO) Take by mouth.     testosterone cypionate (DEPO-TESTOSTERONE) 200 MG/ML injection Inject 0.5 mLs (100 mg total) into the muscle 2 (two) times a week. 10 mL 2   vitamin C (ASCORBIC ACID) 500 MG  tablet Take 500 mg by mouth daily.     Whey Protein POWD Take by mouth.     No current facility-administered medications for this visit.    Allergies as of 01/10/2023 - Review Complete 01/10/2023  Allergen Reaction Noted   Penicillins Hives and Other (See Comments) 01/09/2017   Sulfa antibiotics Hives 01/09/2017    No family history on file.  Social History   Socioeconomic History   Marital status: Married    Spouse name: Not on file   Number of children: Not on file   Years of education: Not on file   Highest education level: Not on file  Occupational History   Not on file  Tobacco Use   Smoking status: Never   Smokeless tobacco: Current    Types: Snuff  Vaping Use   Vaping Use: Never used  Substance and Sexual Activity   Alcohol use: Yes    Comment: 1-2 drinks a month   Drug use: No   Sexual activity: Yes  Other Topics Concern   Not on file  Social History Narrative   Married since 2013.Lives with wife and 3 kids.Previously Midwife.Ex-marine,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   Social Determinants of Health   Financial Resource Strain: Not on file  Food Insecurity: Not on file  Transportation Needs: Not  on file  Physical Activity: Not on file  Stress: Not on file  Social Connections: Not on file  Intimate Partner Violence: Not on file     Review of Systems   Gen: Denies any fever, chills, fatigue, weight loss, lack of appetite.  CV: Denies chest pain, heart palpitations, peripheral edema, syncope.  Resp: Denies shortness of breath at rest or with exertion. Denies wheezing or cough.  GI: Denies dysphagia or odynophagia. Denies jaundice, hematemesis, fecal incontinence. GU : Denies urinary burning, urinary frequency, urinary hesitancy MS: Denies joint pain, muscle weakness, cramps, or limitation of movement.  Derm: Denies rash, itching, dry skin Psych: Denies depression, anxiety, memory loss, and confusion Heme: Denies bruising, bleeding, and enlarged  lymph nodes.   Physical Exam   BP (!) 133/91 (BP Location: Right Arm, Patient Position: Sitting, Cuff Size: Large)   Pulse 80   Temp 98.4 F (36.9 C) (Oral)   Ht 6' (1.829 m)   Wt 202 lb 8 oz (91.9 kg)   SpO2 98%   BMI 27.46 kg/m  General:   Alert and oriented. Pleasant and cooperative. Well-nourished and well-developed.  Head:  Normocephalic and atraumatic. Eyes:  Without icterus Rectal:  external exam without any thrombosis, no prolapsing, no external tags Msk:  Symmetrical without gross deformities. Normal posture. Extremities:  Without edema. Neurologic:  Alert and  oriented x4;  grossly normal neurologically. Skin:  Intact without significant lesions or rashes. Psych:  Alert and cooperative. Normal mood and affect.   Assessment   Joseph Sanders is a 32 y.o. male presenting today in follow-up with a history of thrombosed external hemorrhoid in March 2024 that has responded nicely to supportive measures.  We discussed signs/symptoms to monitor in the future. Continue with bowel regimen, avoidance of straining, limiting toilet time. He will call if he desires any therapeutic options in the future.    PLAN    Avoid straining Recommend fiber daily Stool softeners prn Return prn Call if any recurrence   Gelene Mink, PhD, ANP-BC [facility]

## 2023-01-10 NOTE — Patient Instructions (Signed)
Continue to avoid constipation and use stool softeners as needed.   I recommend supplemental fiber like Benefiber 2 teaspoons daily in beverage of your choice.  Please call or message if you have any recurrent flares!  Have a great time at [facility]!  I enjoyed seeing you again today! At our first visit, I mentioned how I value our relationship and want to provide genuine, compassionate, and quality care. You may receive a survey regarding your visit with me, and I welcome your feedback! Thanks so much for taking the time to complete this. I look forward to seeing you again.   Gelene Mink, PhD, ANP-BC [facility]

## 2023-04-29 DIAGNOSIS — R748 Abnormal levels of other serum enzymes: Secondary | ICD-10-CM | POA: Diagnosis not present

## 2023-04-29 DIAGNOSIS — R519 Headache, unspecified: Secondary | ICD-10-CM | POA: Diagnosis not present

## 2023-04-29 DIAGNOSIS — H539 Unspecified visual disturbance: Secondary | ICD-10-CM | POA: Diagnosis not present
